# Patient Record
Sex: Female | Born: 1955 | Race: White | Hispanic: No | Marital: Married
Health system: Southern US, Community
[De-identification: ages and names within clinical notes are randomized; demographics above are authoritative.]

## PROBLEM LIST (undated history)

## (undated) DIAGNOSIS — N952 Postmenopausal atrophic vaginitis: Secondary | ICD-10-CM

## (undated) DIAGNOSIS — E785 Hyperlipidemia, unspecified: Secondary | ICD-10-CM

## (undated) DIAGNOSIS — F5104 Psychophysiologic insomnia: Secondary | ICD-10-CM

## (undated) DIAGNOSIS — H02833 Dermatochalasis of right eye, unspecified eyelid: Secondary | ICD-10-CM

## (undated) DIAGNOSIS — F909 Attention-deficit hyperactivity disorder, unspecified type: Secondary | ICD-10-CM

## (undated) DIAGNOSIS — G2581 Restless legs syndrome: Secondary | ICD-10-CM

## (undated) DIAGNOSIS — F411 Generalized anxiety disorder: Secondary | ICD-10-CM

## (undated) DIAGNOSIS — I10 Essential (primary) hypertension: Secondary | ICD-10-CM

## (undated) DIAGNOSIS — Z961 Presence of intraocular lens: Secondary | ICD-10-CM

## (undated) DIAGNOSIS — K219 Gastro-esophageal reflux disease without esophagitis: Secondary | ICD-10-CM

## (undated) HISTORY — DX: Generalized anxiety disorder: F41.1

## (undated) HISTORY — DX: Gastro-esophageal reflux disease without esophagitis: K21.9

## (undated) HISTORY — DX: Essential (primary) hypertension: I10

## (undated) HISTORY — PX: CATARACT EXTRACTION: SUR2

## (undated) HISTORY — PX: ROTATOR CUFF REPAIR: SHX139

## (undated) HISTORY — DX: Presence of intraocular lens: Z96.1

## (undated) HISTORY — PX: OTHER SURGICAL HISTORY: SHX169

## (undated) HISTORY — DX: Psychophysiologic insomnia: F51.04

## (undated) HISTORY — DX: Dermatochalasis of right eye, unspecified eyelid: H02.833

## (undated) HISTORY — DX: Attention-deficit hyperactivity disorder, unspecified type: F90.9

## (undated) HISTORY — DX: Restless legs syndrome: G25.81

## (undated) HISTORY — PX: FOOT SURGERY: SHX648

## (undated) HISTORY — PX: ELBOW SURGERY: SHX618

## (undated) HISTORY — DX: Hyperlipidemia, unspecified: E78.5

## (undated) HISTORY — DX: Postmenopausal atrophic vaginitis: N95.2

## (undated) HISTORY — PX: TONSILLECTOMY: SUR1361

## (undated) HISTORY — PX: ABDOMINAL HYSTERECTOMY: SHX81

## (undated) HISTORY — PX: INCONTINENCE SURGERY: SHX676

---

## 1998-11-14 ENCOUNTER — Other Ambulatory Visit: Admission: RE | Admit: 1998-11-14 | Discharge: 1998-11-14 | Payer: Self-pay | Admitting: *Deleted

## 2004-06-02 ENCOUNTER — Ambulatory Visit: Payer: Self-pay

## 2005-08-10 ENCOUNTER — Ambulatory Visit: Payer: Self-pay

## 2006-07-19 ENCOUNTER — Ambulatory Visit: Payer: Self-pay

## 2007-07-27 ENCOUNTER — Ambulatory Visit: Payer: Self-pay

## 2008-08-02 ENCOUNTER — Ambulatory Visit: Payer: Self-pay

## 2008-08-20 ENCOUNTER — Ambulatory Visit: Payer: Self-pay

## 2008-10-23 ENCOUNTER — Ambulatory Visit: Payer: Self-pay | Admitting: Gastroenterology

## 2008-12-10 ENCOUNTER — Ambulatory Visit: Payer: Self-pay | Admitting: Gastroenterology

## 2009-08-19 ENCOUNTER — Ambulatory Visit: Payer: Self-pay

## 2010-05-12 ENCOUNTER — Ambulatory Visit: Payer: Self-pay | Admitting: Internal Medicine

## 2010-09-15 ENCOUNTER — Ambulatory Visit: Payer: Self-pay | Admitting: Gastroenterology

## 2010-11-05 ENCOUNTER — Ambulatory Visit: Payer: Self-pay

## 2011-02-05 ENCOUNTER — Telehealth: Payer: Self-pay | Admitting: *Deleted

## 2011-02-05 NOTE — Telephone Encounter (Signed)
Dr Sheryn Bison reviewed records from [facility] and [facility], he declined to accept patient. Dr Rhea Belton states that the referring MD should maybe consider Dr Ruthell Rummage at [facility] because he sees patients with possible genetic polyp syndrom. I have advised the referring office and they will let the patient know.

## 2011-12-10 ENCOUNTER — Ambulatory Visit: Payer: Self-pay | Admitting: Family Medicine

## 2013-01-12 ENCOUNTER — Ambulatory Visit: Payer: Self-pay | Admitting: Family Medicine

## 2014-02-01 ENCOUNTER — Ambulatory Visit: Payer: Self-pay | Admitting: Family Medicine

## 2014-02-20 ENCOUNTER — Ambulatory Visit: Payer: Self-pay | Admitting: Family Medicine

## 2014-04-01 ENCOUNTER — Ambulatory Visit: Payer: Self-pay | Admitting: Internal Medicine

## 2014-04-01 LAB — COMPREHENSIVE METABOLIC PANEL
ANION GAP: 7 (ref 7–16)
Albumin: 3.6 g/dL (ref 3.4–5.0)
Alkaline Phosphatase: 91 U/L
BUN: 10 mg/dL (ref 7–18)
Bilirubin,Total: 0.5 mg/dL (ref 0.2–1.0)
CALCIUM: 9.2 mg/dL (ref 8.5–10.1)
CO2: 30 mmol/L (ref 21–32)
Chloride: 101 mmol/L (ref 98–107)
Creatinine: 0.93 mg/dL (ref 0.60–1.30)
EGFR (Non-African Amer.): >60
Glucose: 111 mg/dL — ABNORMAL HIGH (ref 65–99)
Osmolality: 275 (ref 275–301)
Potassium: 4.2 mmol/L (ref 3.5–5.1)
SGOT(AST): 19 U/L (ref 15–37)
SGPT (ALT): 15 U/L
SODIUM: 138 mmol/L (ref 136–145)
TOTAL PROTEIN: 7.3 g/dL (ref 6.4–8.2)

## 2014-04-01 LAB — URINALYSIS, COMPLETE
BACTERIA: NEGATIVE
Bilirubin,UR: NEGATIVE
Glucose,UR: NEGATIVE
KETONE: NEGATIVE
Leukocyte Esterase: NEGATIVE
NITRITE: NEGATIVE
PROTEIN: NEGATIVE
Ph: 5.5 (ref 5.0–8.0)
RBC,UR: >30 /HPF (ref 0–5)
Specific Gravity: 1.025 (ref 1.000–1.030)

## 2014-04-01 LAB — CBC WITH DIFFERENTIAL/PLATELET
BASOS ABS: 0.1 10*3/uL (ref 0.0–0.1)
Basophil %: 0.7 %
Eosinophil #: 0 10*3/uL (ref 0.0–0.7)
Eosinophil %: 0.4 %
HCT: 41 % (ref 35.0–47.0)
HGB: 13.5 g/dL (ref 12.0–16.0)
LYMPHS PCT: 14.7 %
Lymphocyte #: 1.1 10*3/uL (ref 1.0–3.6)
MCH: 28.9 pg (ref 26.0–34.0)
MCHC: 32.9 g/dL (ref 32.0–36.0)
MCV: 88 fL (ref 80–100)
MONOS PCT: 16.9 %
Monocyte #: 1.3 x10 3/mm — ABNORMAL HIGH (ref 0.2–0.9)
NEUTROS ABS: 5.1 10*3/uL (ref 1.4–6.5)
Neutrophil %: 67.3 %
PLATELETS: 208 10*3/uL (ref 150–440)
RBC: 4.67 10*6/uL (ref 3.80–5.20)
RDW: 13.4 % (ref 11.5–14.5)
WBC: 7.6 10*3/uL (ref 3.6–11.0)

## 2014-04-01 LAB — AMYLASE: AMYLASE: 56 U/L (ref 25–115)

## 2014-04-01 LAB — LIPASE, BLOOD: Lipase: 84 U/L (ref 73–393)

## 2014-09-10 ENCOUNTER — Other Ambulatory Visit: Payer: Self-pay | Admitting: Family Medicine

## 2014-09-10 DIAGNOSIS — Z13820 Encounter for screening for osteoporosis: Secondary | ICD-10-CM

## 2014-10-02 ENCOUNTER — Ambulatory Visit
Admission: RE | Admit: 2014-10-02 | Discharge: 2014-10-02 | Disposition: A | Discharge status: Home / Self Care | Payer: Commercial Managed Care - HMO | Source: Ambulatory Visit | Attending: Family Medicine | Admitting: Family Medicine

## 2014-10-02 DIAGNOSIS — Z13820 Encounter for screening for osteoporosis: Secondary | ICD-10-CM | POA: Diagnosis not present

## 2014-10-02 DIAGNOSIS — M810 Age-related osteoporosis without current pathological fracture: Secondary | ICD-10-CM | POA: Diagnosis not present

## 2014-12-18 ENCOUNTER — Other Ambulatory Visit: Payer: Self-pay | Admitting: Family Medicine

## 2014-12-18 DIAGNOSIS — Z1231 Encounter for screening mammogram for malignant neoplasm of breast: Secondary | ICD-10-CM

## 2015-02-05 ENCOUNTER — Ambulatory Visit
Admission: RE | Admit: 2015-02-05 | Discharge: 2015-02-05 | Disposition: A | Discharge status: Home / Self Care | Payer: Commercial Managed Care - HMO | Source: Ambulatory Visit | Attending: Family Medicine | Admitting: Family Medicine

## 2015-02-05 DIAGNOSIS — Z1231 Encounter for screening mammogram for malignant neoplasm of breast: Secondary | ICD-10-CM | POA: Diagnosis not present

## 2015-12-23 ENCOUNTER — Other Ambulatory Visit: Payer: Self-pay | Admitting: Family Medicine

## 2015-12-23 DIAGNOSIS — Z1231 Encounter for screening mammogram for malignant neoplasm of breast: Secondary | ICD-10-CM

## 2016-02-10 ENCOUNTER — Ambulatory Visit: Admission: RE | Admit: 2016-02-10 | Payer: Commercial Managed Care - HMO | Source: Ambulatory Visit

## 2016-02-21 ENCOUNTER — Ambulatory Visit
Admission: RE | Admit: 2016-02-21 | Discharge: 2016-02-21 | Disposition: A | Discharge status: Home / Self Care | Payer: Commercial Managed Care - HMO | Source: Ambulatory Visit | Attending: Family Medicine | Admitting: Family Medicine

## 2016-02-21 DIAGNOSIS — Z1231 Encounter for screening mammogram for malignant neoplasm of breast: Secondary | ICD-10-CM | POA: Insufficient documentation

## 2016-11-25 ENCOUNTER — Ambulatory Visit: Payer: 59 | Attending: Otolaryngology

## 2016-11-25 DIAGNOSIS — G2581 Restless legs syndrome: Secondary | ICD-10-CM | POA: Insufficient documentation

## 2016-11-25 DIAGNOSIS — R100 Acute abdomen: Secondary | ICD-10-CM | POA: Diagnosis present

## 2016-11-25 DIAGNOSIS — R0683 Snoring: Secondary | ICD-10-CM | POA: Diagnosis present

## 2016-11-25 DIAGNOSIS — F5101 Primary insomnia: Secondary | ICD-10-CM | POA: Insufficient documentation

## 2017-02-16 ENCOUNTER — Other Ambulatory Visit: Payer: Self-pay | Admitting: Family Medicine

## 2017-02-16 DIAGNOSIS — Z1231 Encounter for screening mammogram for malignant neoplasm of breast: Secondary | ICD-10-CM

## 2017-03-24 ENCOUNTER — Ambulatory Visit: Payer: 59

## 2017-03-25 ENCOUNTER — Ambulatory Visit
Admission: RE | Admit: 2017-03-25 | Discharge: 2017-03-25 | Disposition: A | Discharge status: Home / Self Care | Payer: 59 | Source: Ambulatory Visit | Attending: Family Medicine | Admitting: Family Medicine

## 2017-03-25 DIAGNOSIS — Z1231 Encounter for screening mammogram for malignant neoplasm of breast: Secondary | ICD-10-CM | POA: Insufficient documentation

## 2017-11-21 IMAGING — MG 2D DIGITAL SCREENING BILATERAL MAMMOGRAM WITH CAD AND ADJUNCT TO
8 of 12 series · 8 of 28 positions shown · non-contrast
Comparison: Previous exam(s).

CLINICAL DATA: Screening.

EXAM:
2D DIGITAL SCREENING BILATERAL MAMMOGRAM WITH CAD AND ADJUNCT TOMO

[L MLO]
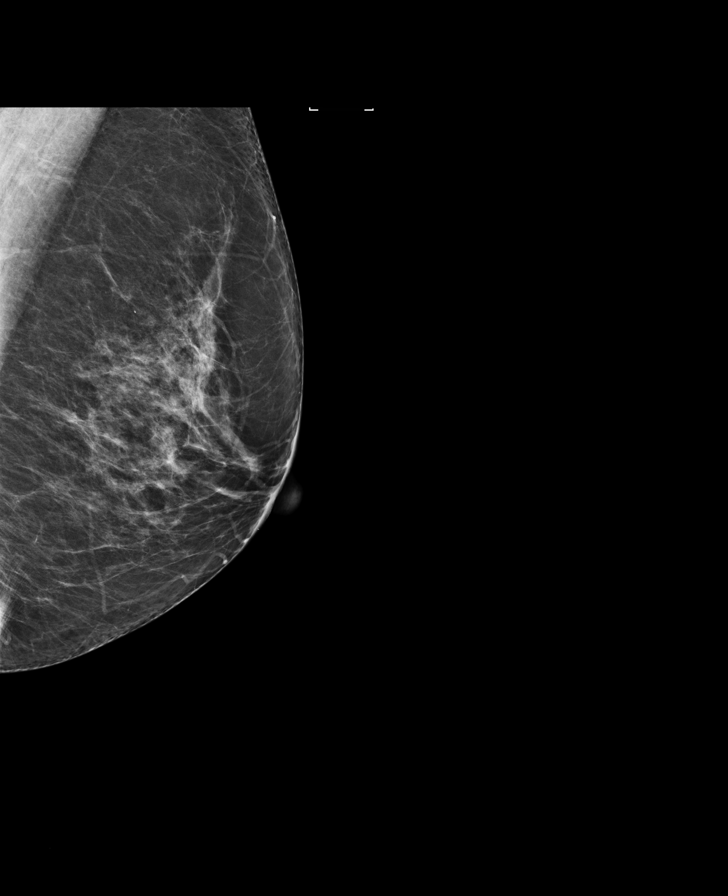

[R CC synth-2D]
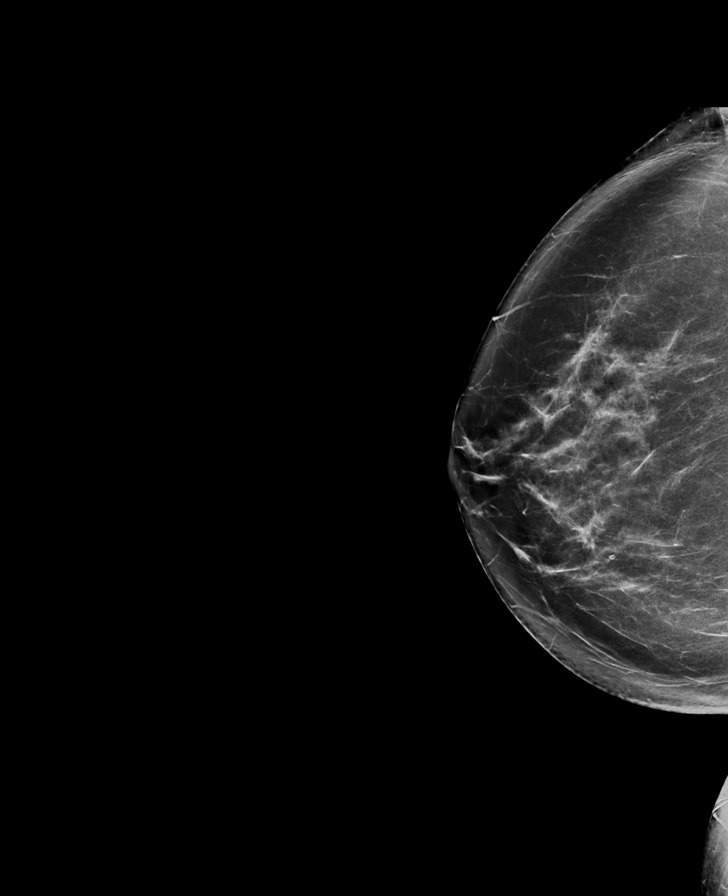

[L CC]
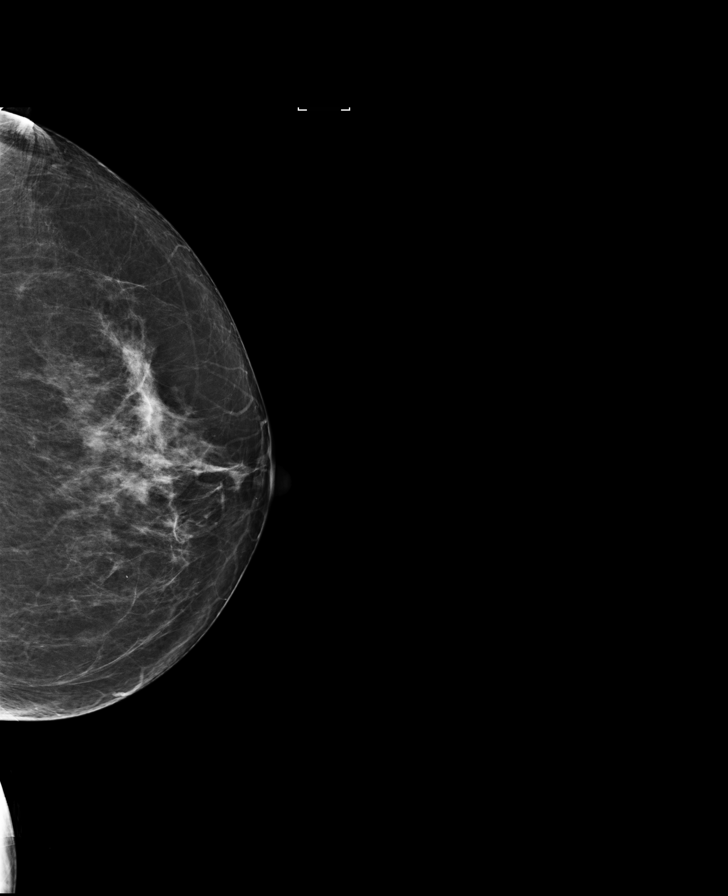

[R MLO synth-2D]
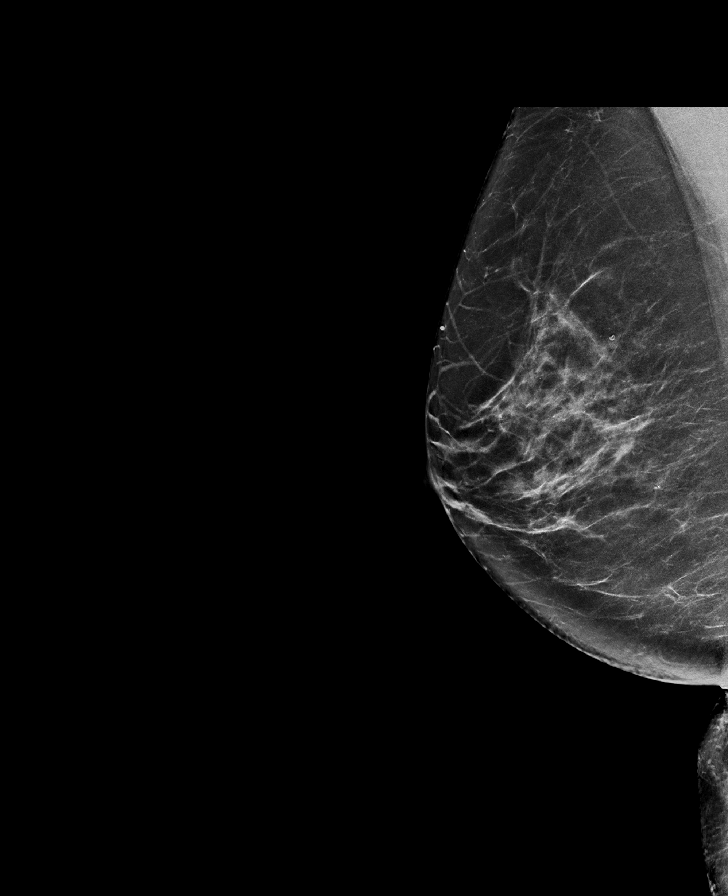

[R CC]
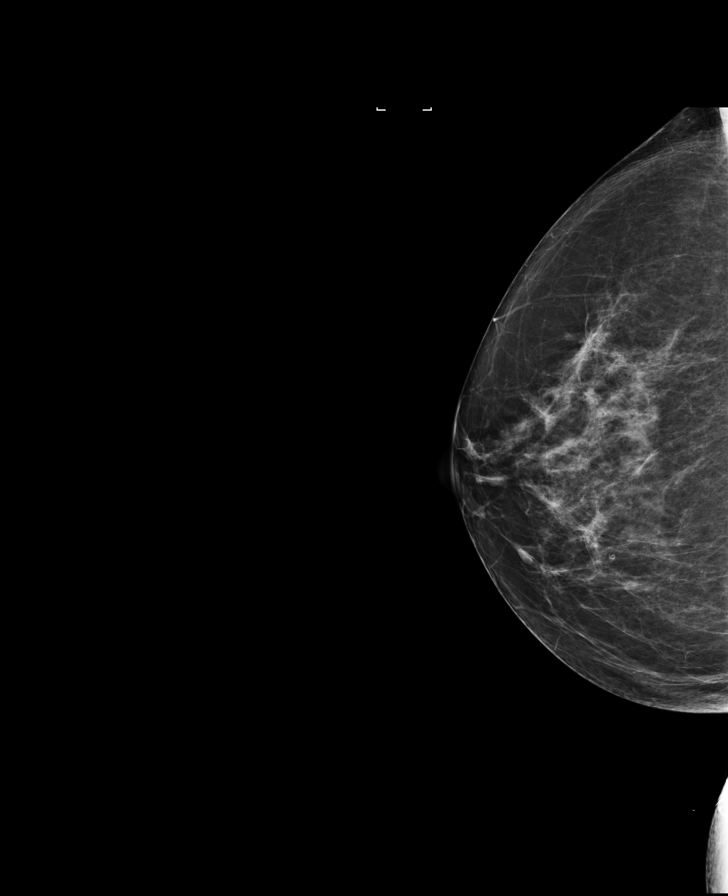

[R MLO]
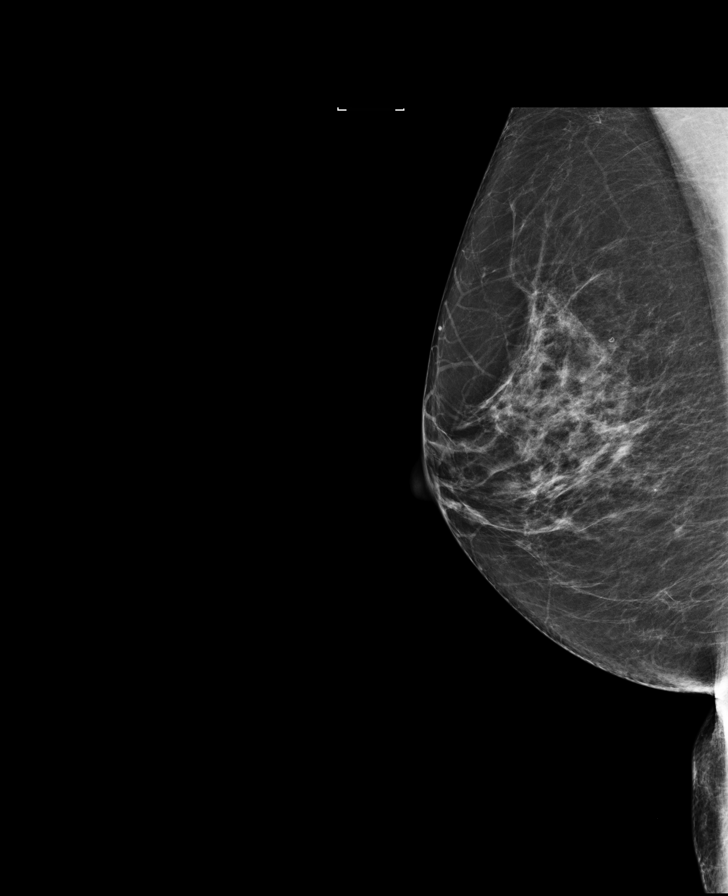

[L MLO synth-2D]
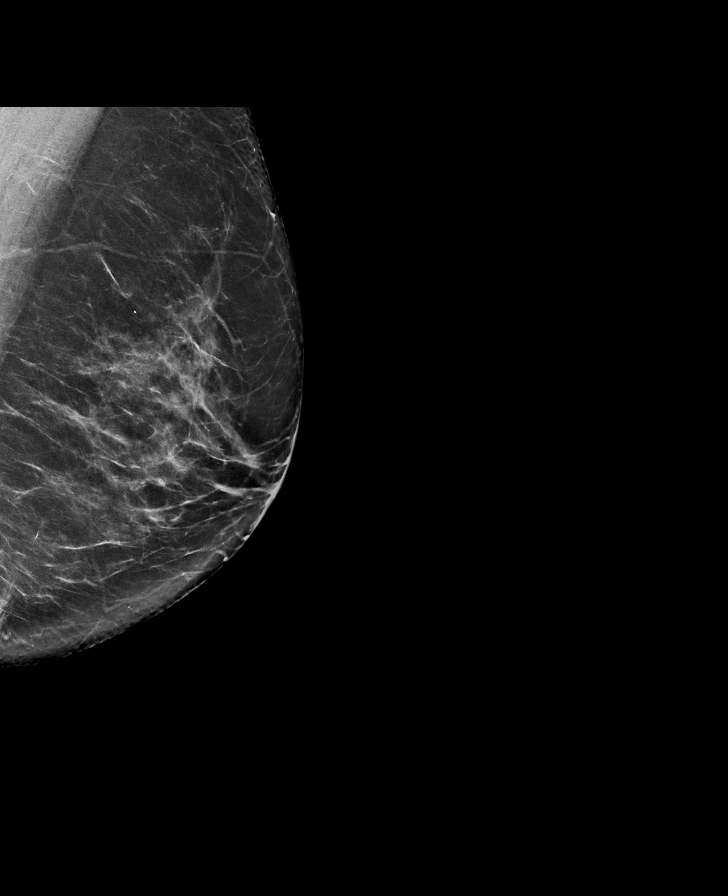

[L CC synth-2D]
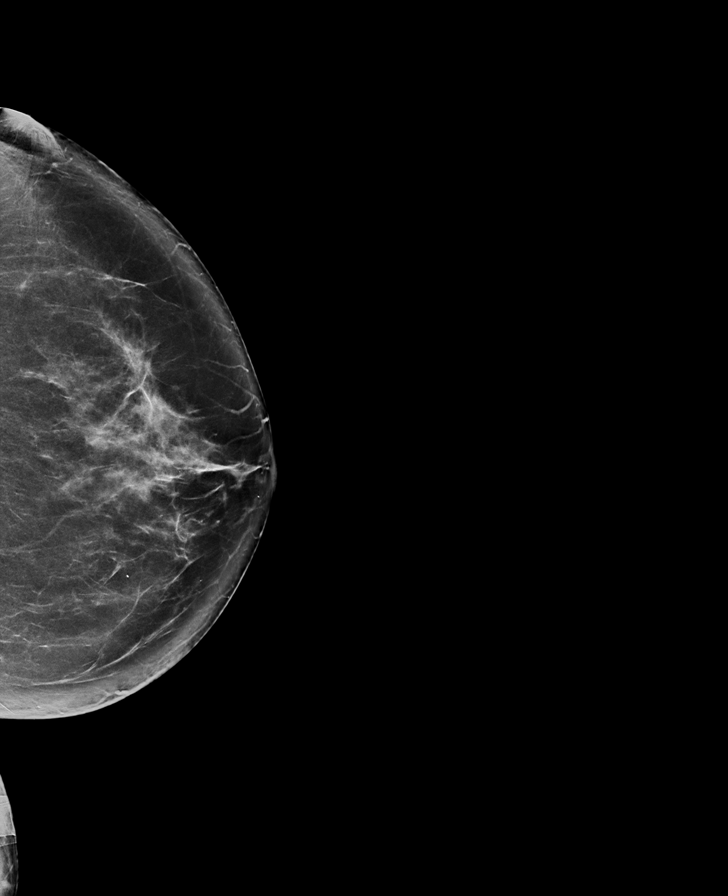

[8 of 28 positions shown; findings below may reference images not displayed]

ACR Breast Density Category c: The breast tissue is heterogeneously
dense, which may obscure small masses.
FINDINGS: There are no findings suspicious for malignancy. Images were
processed with CAD.
IMPRESSION: No mammographic evidence of malignancy. A result letter of this
screening mammogram will be mailed directly to the patient.

RECOMMENDATION:
Screening mammogram in one year. (Code:[id number])

BI-RADS CATEGORY  1: Negative.

## 2018-03-14 ENCOUNTER — Other Ambulatory Visit: Payer: Self-pay | Admitting: Family Medicine

## 2018-03-14 DIAGNOSIS — Z1231 Encounter for screening mammogram for malignant neoplasm of breast: Secondary | ICD-10-CM

## 2018-03-28 ENCOUNTER — Ambulatory Visit
Admission: RE | Admit: 2018-03-28 | Discharge: 2018-03-28 | Disposition: A | Discharge status: Home / Self Care | Payer: 59 | Source: Ambulatory Visit | Attending: Family Medicine | Admitting: Family Medicine

## 2018-03-28 DIAGNOSIS — Z1231 Encounter for screening mammogram for malignant neoplasm of breast: Secondary | ICD-10-CM | POA: Insufficient documentation

## 2018-06-22 ENCOUNTER — Other Ambulatory Visit: Payer: Self-pay | Admitting: Family Medicine

## 2018-06-22 ENCOUNTER — Ambulatory Visit
Admission: RE | Admit: 2018-06-22 | Discharge: 2018-06-22 | Disposition: A | Discharge status: Home / Self Care | Payer: 59 | Source: Ambulatory Visit | Attending: Family Medicine | Admitting: Family Medicine

## 2018-06-22 ENCOUNTER — Ambulatory Visit
Admission: RE | Admit: 2018-06-22 | Discharge: 2018-06-22 | Disposition: A | Discharge status: Home / Self Care | Payer: 59 | Attending: Family Medicine | Admitting: Family Medicine

## 2018-06-22 DIAGNOSIS — M546 Pain in thoracic spine: Secondary | ICD-10-CM | POA: Diagnosis not present

## 2019-03-06 ENCOUNTER — Other Ambulatory Visit: Payer: Self-pay | Admitting: Family Medicine

## 2019-03-06 DIAGNOSIS — Z1231 Encounter for screening mammogram for malignant neoplasm of breast: Secondary | ICD-10-CM

## 2019-03-30 ENCOUNTER — Other Ambulatory Visit: Payer: Self-pay

## 2019-03-30 ENCOUNTER — Ambulatory Visit
Admission: RE | Admit: 2019-03-30 | Discharge: 2019-03-30 | Disposition: A | Discharge status: Home / Self Care | Payer: Medicare Other | Source: Ambulatory Visit | Attending: Family Medicine | Admitting: Family Medicine

## 2019-03-30 DIAGNOSIS — Z1231 Encounter for screening mammogram for malignant neoplasm of breast: Secondary | ICD-10-CM | POA: Diagnosis not present

## 2019-12-07 ENCOUNTER — Other Ambulatory Visit: Payer: Self-pay

## 2019-12-07 DIAGNOSIS — Z780 Asymptomatic menopausal state: Secondary | ICD-10-CM

## 2020-02-13 ENCOUNTER — Other Ambulatory Visit: Payer: Self-pay

## 2020-02-13 DIAGNOSIS — Z1231 Encounter for screening mammogram for malignant neoplasm of breast: Secondary | ICD-10-CM

## 2020-04-01 ENCOUNTER — Ambulatory Visit
Admission: RE | Admit: 2020-04-01 | Discharge: 2020-04-01 | Disposition: A | Discharge status: Home / Self Care | Payer: Medicare Other | Source: Ambulatory Visit

## 2020-04-01 ENCOUNTER — Other Ambulatory Visit: Payer: Self-pay

## 2020-04-01 DIAGNOSIS — Z780 Asymptomatic menopausal state: Secondary | ICD-10-CM | POA: Diagnosis not present

## 2020-04-01 DIAGNOSIS — Z1231 Encounter for screening mammogram for malignant neoplasm of breast: Secondary | ICD-10-CM | POA: Diagnosis present

## 2021-03-20 ENCOUNTER — Other Ambulatory Visit: Payer: Self-pay

## 2021-03-20 ENCOUNTER — Other Ambulatory Visit: Payer: Self-pay | Admitting: Family Medicine

## 2021-03-20 DIAGNOSIS — Z1231 Encounter for screening mammogram for malignant neoplasm of breast: Secondary | ICD-10-CM

## 2021-05-07 ENCOUNTER — Ambulatory Visit
Admission: RE | Admit: 2021-05-07 | Discharge: 2021-05-07 | Disposition: A | Discharge status: Home / Self Care | Payer: Medicare Other | Source: Ambulatory Visit | Attending: Family Medicine | Admitting: Family Medicine

## 2021-05-07 ENCOUNTER — Other Ambulatory Visit: Payer: Self-pay

## 2021-05-07 DIAGNOSIS — Z1231 Encounter for screening mammogram for malignant neoplasm of breast: Secondary | ICD-10-CM | POA: Insufficient documentation

## 2021-11-18 ENCOUNTER — Ambulatory Visit
Admission: RE | Admit: 2021-11-18 | Discharge: 2021-11-18 | Disposition: A | Discharge status: Home / Self Care | Payer: Medicare Other | Source: Ambulatory Visit | Attending: Family Medicine | Admitting: Family Medicine

## 2021-11-18 ENCOUNTER — Other Ambulatory Visit: Payer: Self-pay | Admitting: Family Medicine

## 2021-11-18 DIAGNOSIS — R140 Abdominal distension (gaseous): Secondary | ICD-10-CM | POA: Diagnosis not present

## 2021-11-18 DIAGNOSIS — R311 Benign essential microscopic hematuria: Secondary | ICD-10-CM

## 2021-12-30 ENCOUNTER — Other Ambulatory Visit: Payer: Self-pay | Admitting: *Deleted

## 2021-12-30 ENCOUNTER — Other Ambulatory Visit
Admission: RE | Admit: 2021-12-30 | Discharge: 2021-12-30 | Disposition: A | Discharge status: Home / Self Care | Payer: Medicare Other | Attending: Urology | Admitting: Urology

## 2021-12-30 ENCOUNTER — Encounter: Payer: Self-pay | Admitting: Urology

## 2021-12-30 ENCOUNTER — Ambulatory Visit (INDEPENDENT_AMBULATORY_CARE_PROVIDER_SITE_OTHER): Payer: Medicare Other | Admitting: Urology

## 2021-12-30 VITALS — BP 148/77 | HR 76 | Ht 66.0 in | Wt 161.8 lb

## 2021-12-30 DIAGNOSIS — R3129 Other microscopic hematuria: Secondary | ICD-10-CM

## 2021-12-30 DIAGNOSIS — R3121 Asymptomatic microscopic hematuria: Secondary | ICD-10-CM | POA: Diagnosis not present

## 2021-12-30 DIAGNOSIS — N3281 Overactive bladder: Secondary | ICD-10-CM | POA: Diagnosis not present

## 2021-12-30 LAB — URINALYSIS, COMPLETE (UACMP) WITH MICROSCOPIC
Bilirubin Urine: NEGATIVE
Glucose, UA: NEGATIVE mg/dL
Ketones, ur: NEGATIVE mg/dL
Leukocytes,Ua: NEGATIVE
Nitrite: NEGATIVE
Protein, ur: NEGATIVE mg/dL
Specific Gravity, Urine: 1.02 (ref 1.005–1.030)
pH: 5.5 (ref 5.0–8.0)

## 2021-12-30 NOTE — Progress Notes (Signed)
   12/30/21 10:25 AM   Wendy Copeland 08-Jul-1955 [id number]  CC: " Microscopic hematuria," Overactive bladder  HPI: I saw Wendy Copeland today for the above issues.  She is a 66 year old female who was recently found to have dipstick positive blood on urinalysis, but no microscopic was performed.  She also has some nocturia once per night with some urgency and occasional urge incontinence overnight.  She denies any significant urinary symptoms during the day.  No gross hematuria.  She has a history of a partial hysterectomy for fibroids as well as a urethral sling(unclear type and no notes available) from 2005.  Reportedly she had to perform intermittent catheterization for a few months at that time.   PMH: Past Medical History:  Diagnosis Date   ADHD    Atrophic vaginitis    Chronic insomnia    Dermatochalasis of both eyelids    GAD (generalized anxiety disorder)    GERD (gastroesophageal reflux disease)    HTN (hypertension)    Hyperlipidemia    Pseudophakia    RLS (restless legs syndrome)     Surgical History: Past Surgical History:  Procedure Laterality Date   ABDOMINAL HYSTERECTOMY     BLEPHAROPLASTY UPPER EYELID     CATARACT EXTRACTION     CESAREAN SECTION     ELBOW SURGERY     FOOT SURGERY     INCONTINENCE SURGERY     ROTATOR CUFF REPAIR     TONSILLECTOMY       Family History: Family History  Problem Relation Age of Onset   Breast cancer Mother 42    Social History:  has no history on file for tobacco use, alcohol use, and drug use.  Physical Exam: BP (!) 148/77 (BP Location: Left Arm, Patient Position: Sitting, Cuff Size: Normal)   Pulse 76   Ht 5\' 6"  (1.676 m)   Wt 161 lb 12.8 oz (73.4 kg)   BMI 26.12 kg/m    Constitutional:  Alert and oriented, No acute distress. Cardiovascular: No clubbing, cyanosis, or edema. Respiratory: Normal respiratory effort, no increased work of breathing. GI: Abdomen is soft, nontender, nondistended, no abdominal  masses  Assessment & Plan:   66 year old female with dipstick positive blood in the urine, but no microscopic analysis.  Urine today is pending.  She has some mild symptoms of nocturia once overnight with some occasional urge and urge incontinence in the evening.  We reviewed behavioral strategies, and she is not bothered enough at this time to consider medications.  We discussed common possible etiologies of microscopic hematuria including idiopathic, urolithiasis, medical renal disease, and malignancy. We discussed the new asymptomatic microscopic hematuria guidelines and risk categories of low, intermediate, and high risk that are based on age, risk factors like smoking, and degree of microscopic hematuria. We discussed work-up can range from repeat urinalysis, renal ultrasound and cystoscopy, to CT urogram and cystoscopy.  Follow-up urinalysis, if microscopic hematuria present would recommend cystoscopy.  Would need Valium prior.  If urinalysis benign can follow-up as needed  77, MD 12/30/2021  [facility] [address] [place], [place] [phone]

## 2021-12-30 NOTE — Patient Instructions (Signed)
We will call with your urinalysis results, if there is blood under the microscope would recommend proceeding with cystoscopy to rule out any issues within the bladder or problems from your prior surgeries.   Cystoscopy Cystoscopy is a procedure that is used to help diagnose and sometimes treat conditions that affect the lower urinary tract. The lower urinary tract includes the bladder and the urethra. The urethra is the tube that drains urine from the bladder. Cystoscopy is done using a thin, tube-shaped instrument with a light and camera at the end (cystoscope). The cystoscope may be hard or flexible, depending on the goal of the procedure. The cystoscope is inserted through the urethra, into the bladder. Cystoscopy may be recommended if you have: Urinary tract infections that keep coming back. Blood in the urine (hematuria). An inability to control when you urinate (urinary incontinence) or an overactive bladder. Unusual cells found in a urine sample. A blockage in the urethra, such as a urinary stone. Painful urination. An abnormality in the bladder found during an intravenous pyelogram (IVP) or CT scan. What are the risks? Generally, this is a safe procedure. However, problems may occur, including: Infection. Bleeding.  What happens during the procedure?  You will be given one or more of the following: A medicine to numb the area (local anesthetic). The area around the opening of your urethra will be cleaned. The cystoscope will be passed through your urethra into your bladder. Germ-free (sterile) fluid will flow through the cystoscope to fill your bladder. The fluid will stretch your bladder so that your health care provider can clearly examine your bladder walls. Your doctor will look at the urethra and bladder. The cystoscope will be removed The procedure may vary among health care providers  What can I expect after the procedure? After the procedure, it is common to have: Some  soreness or pain in your urethra. Urinary symptoms. These include: Mild pain or burning when you urinate. Pain should stop within a few minutes after you urinate. This may last for up to a few days after the procedure. A small amount of blood in your urine for several days. Feeling like you need to urinate but producing only a small amount of urine. Follow these instructions at home: General instructions Return to your normal activities as told by your health care provider.  Drink plenty of fluids after the procedure. Keep all follow-up visits as told by your health care provider. This is important. Contact a health care provider if you: Have pain that gets worse or does not get better with medicine, especially pain when you urinate lasting longer than 72 hours after the procedure. Have trouble urinating. Get help right away if you: Have blood clots in your urine. Have a fever or chills. Are unable to urinate. Summary Cystoscopy is a procedure that is used to help diagnose and sometimes treat conditions that affect the lower urinary tract. Cystoscopy is done using a thin, tube-shaped instrument with a light and camera at the end. After the procedure, it is common to have some soreness or pain in your urethra. It is normal to have blood in your urine after the procedure.  If you were prescribed an antibiotic medicine, take it as told by your health care provider.  This information is not intended to replace advice given to you by your health care provider. Make sure you discuss any questions you have with your health care provider. Document Revised: 04/19/2018 Document Reviewed: 04/19/2018 Elsevier Patient Education  2020  [employer].

## 2022-03-27 ENCOUNTER — Other Ambulatory Visit: Payer: Self-pay | Admitting: Family Medicine

## 2022-03-27 DIAGNOSIS — Z1231 Encounter for screening mammogram for malignant neoplasm of breast: Secondary | ICD-10-CM

## 2022-05-12 ENCOUNTER — Ambulatory Visit
Admission: RE | Admit: 2022-05-12 | Discharge: 2022-05-12 | Disposition: A | Discharge status: Home / Self Care | Payer: Medicare Other | Source: Ambulatory Visit | Attending: Family Medicine | Admitting: Family Medicine

## 2022-05-12 DIAGNOSIS — Z1231 Encounter for screening mammogram for malignant neoplasm of breast: Secondary | ICD-10-CM | POA: Insufficient documentation

## 2023-04-05 ENCOUNTER — Other Ambulatory Visit: Payer: Self-pay | Admitting: Physician Assistant

## 2023-04-05 DIAGNOSIS — Z1231 Encounter for screening mammogram for malignant neoplasm of breast: Secondary | ICD-10-CM

## 2023-05-17 ENCOUNTER — Ambulatory Visit
Admission: RE | Admit: 2023-05-17 | Discharge: 2023-05-17 | Disposition: A | Discharge status: Home / Self Care | Payer: Medicare Other | Source: Ambulatory Visit | Attending: Physician Assistant | Admitting: Physician Assistant

## 2023-05-17 DIAGNOSIS — Z1231 Encounter for screening mammogram for malignant neoplasm of breast: Secondary | ICD-10-CM | POA: Insufficient documentation

## 2024-04-03 ENCOUNTER — Other Ambulatory Visit: Payer: Self-pay | Admitting: Physician Assistant

## 2024-04-03 DIAGNOSIS — Z1231 Encounter for screening mammogram for malignant neoplasm of breast: Secondary | ICD-10-CM

## 2024-05-17 ENCOUNTER — Ambulatory Visit
Admission: RE | Admit: 2024-05-17 | Discharge: 2024-05-17 | Disposition: A | Discharge status: Home / Self Care | Source: Ambulatory Visit | Attending: Physician Assistant | Admitting: Physician Assistant

## 2024-05-17 DIAGNOSIS — Z1231 Encounter for screening mammogram for malignant neoplasm of breast: Secondary | ICD-10-CM | POA: Diagnosis present
